# Patient Record
Sex: Female | Born: 1975 | Race: White | Hispanic: No | Smoking: Never smoker
Health system: Southern US, Community
[De-identification: ages and names within clinical notes are randomized; demographics above are authoritative.]

## PROBLEM LIST (undated history)

## (undated) DIAGNOSIS — IMO0002 Reserved for concepts with insufficient information to code with codable children: Secondary | ICD-10-CM

## (undated) DIAGNOSIS — R87619 Unspecified abnormal cytological findings in specimens from cervix uteri: Secondary | ICD-10-CM

## (undated) DIAGNOSIS — R112 Nausea with vomiting, unspecified: Secondary | ICD-10-CM

## (undated) DIAGNOSIS — O1400 Mild to moderate pre-eclampsia, unspecified trimester: Secondary | ICD-10-CM

## (undated) DIAGNOSIS — Z9889 Other specified postprocedural states: Secondary | ICD-10-CM

## (undated) DIAGNOSIS — O10919 Unspecified pre-existing hypertension complicating pregnancy, unspecified trimester: Secondary | ICD-10-CM

## (undated) DIAGNOSIS — K219 Gastro-esophageal reflux disease without esophagitis: Secondary | ICD-10-CM

## (undated) DIAGNOSIS — O139 Gestational [pregnancy-induced] hypertension without significant proteinuria, unspecified trimester: Secondary | ICD-10-CM

## (undated) HISTORY — PX: BREAST SURGERY: SHX581

## (undated) HISTORY — PX: TONSILLECTOMY: SUR1361

---

## 1999-05-28 ENCOUNTER — Inpatient Hospital Stay (HOSPITAL_COMMUNITY): Admission: AD | Admit: 1999-05-28 | Discharge: 1999-05-30 | Payer: Self-pay | Admitting: *Deleted

## 1999-06-24 ENCOUNTER — Other Ambulatory Visit: Admission: RE | Admit: 1999-06-24 | Discharge: 1999-06-24 | Payer: Self-pay | Admitting: *Deleted

## 2000-07-12 ENCOUNTER — Other Ambulatory Visit: Admission: RE | Admit: 2000-07-12 | Discharge: 2000-07-12 | Payer: Self-pay | Admitting: *Deleted

## 2003-10-26 HISTORY — PX: OTHER SURGICAL HISTORY: SHX169

## 2003-11-20 ENCOUNTER — Other Ambulatory Visit: Admission: RE | Admit: 2003-11-20 | Discharge: 2003-11-20 | Payer: Self-pay | Admitting: Obstetrics and Gynecology

## 2005-01-08 ENCOUNTER — Emergency Department (HOSPITAL_COMMUNITY): Admission: EM | Admit: 2005-01-08 | Discharge: 2005-01-08 | Payer: Self-pay | Admitting: *Deleted

## 2008-10-25 HISTORY — PX: CARPAL TUNNEL RELEASE: SHX101

## 2009-10-25 HISTORY — PX: TRIGGER FINGER RELEASE: SHX641

## 2010-10-25 NOTE — L&D Delivery Note (Signed)
Delivery Note At 5:55 PM a viable and healthy female was delivered via Vaginal, Spontaneous Delivery (Presentation: Left Occiput Anterior).  APGAR: 7, 8; weight 7 lb 9.5 oz (3445 g).   Placenta status: Intact, Spontaneous.  Cord: 3 vessels with the following complications: None.  Cord pH: na  Anesthesia: Epidural  Episiotomy: none Lacerations: Second degree, no extensions. Suture Repair: 2.0 vicryl rapide Est. Blood Loss (mL): 300  Mom to postpartum.  Baby to nursery-stable.  Kriss Perleberg J 07/25/2011, 6:11 PM

## 2011-01-05 LAB — ABO/RH: RH Type: POSITIVE

## 2011-01-05 LAB — RUBELLA ANTIBODY, IGM: Rubella: IMMUNE

## 2011-01-05 LAB — HIV ANTIBODY (ROUTINE TESTING W REFLEX): HIV: NONREACTIVE

## 2011-07-09 LAB — STREP B DNA PROBE: GBS: POSITIVE

## 2011-07-24 ENCOUNTER — Inpatient Hospital Stay (HOSPITAL_COMMUNITY)
Admission: RE | Admit: 2011-07-24 | Discharge: 2011-07-27 | DRG: 372 | Disposition: A | Discharge status: Home / Self Care | Payer: BC Managed Care – PPO | Source: Ambulatory Visit | Attending: Obstetrics and Gynecology | Admitting: Obstetrics and Gynecology

## 2011-07-24 ENCOUNTER — Encounter (HOSPITAL_COMMUNITY): Payer: Self-pay

## 2011-07-24 DIAGNOSIS — O10919 Unspecified pre-existing hypertension complicating pregnancy, unspecified trimester: Secondary | ICD-10-CM

## 2011-07-24 DIAGNOSIS — I10 Essential (primary) hypertension: Secondary | ICD-10-CM | POA: Diagnosis present

## 2011-07-24 DIAGNOSIS — Z22330 Carrier of Group B streptococcus: Secondary | ICD-10-CM

## 2011-07-24 DIAGNOSIS — O09529 Supervision of elderly multigravida, unspecified trimester: Secondary | ICD-10-CM | POA: Diagnosis present

## 2011-07-24 DIAGNOSIS — IMO0002 Reserved for concepts with insufficient information to code with codable children: Principal | ICD-10-CM | POA: Diagnosis present

## 2011-07-24 DIAGNOSIS — O99892 Other specified diseases and conditions complicating childbirth: Secondary | ICD-10-CM | POA: Diagnosis present

## 2011-07-24 DIAGNOSIS — O1400 Mild to moderate pre-eclampsia, unspecified trimester: Secondary | ICD-10-CM | POA: Diagnosis present

## 2011-07-24 HISTORY — DX: Gastro-esophageal reflux disease without esophagitis: K21.9

## 2011-07-24 HISTORY — DX: Other specified postprocedural states: R11.2

## 2011-07-24 HISTORY — DX: Mild to moderate pre-eclampsia, unspecified trimester: O14.00

## 2011-07-24 HISTORY — DX: Unspecified pre-existing hypertension complicating pregnancy, unspecified trimester: O10.919

## 2011-07-24 HISTORY — DX: Unspecified abnormal cytological findings in specimens from cervix uteri: R87.619

## 2011-07-24 HISTORY — DX: Other specified postprocedural states: Z98.890

## 2011-07-24 HISTORY — DX: Gestational (pregnancy-induced) hypertension without significant proteinuria, unspecified trimester: O13.9

## 2011-07-24 HISTORY — DX: Reserved for concepts with insufficient information to code with codable children: IMO0002

## 2011-07-24 LAB — COMPREHENSIVE METABOLIC PANEL
ALT: 12 U/L (ref 0–35)
Albumin: 3.2 g/dL — ABNORMAL LOW (ref 3.5–5.2)
Alkaline Phosphatase: 102 U/L (ref 39–117)
Chloride: 103 mEq/L (ref 96–112)
GFR calc Af Amer: >60 mL/min (ref >60)
Glucose, Bld: 68 mg/dL — ABNORMAL LOW (ref 70–99)
Potassium: 3.8 mEq/L (ref 3.5–5.1)
Sodium: 134 mEq/L — ABNORMAL LOW (ref 135–145)
Total Bilirubin: 0.5 mg/dL (ref 0.3–1.2)
Total Protein: 6.4 g/dL (ref 6.0–8.3)

## 2011-07-24 LAB — CBC
HCT: 33.1 % — ABNORMAL LOW (ref 36.0–46.0)
RBC: 3.78 MIL/uL — ABNORMAL LOW (ref 3.87–5.11)
RDW: 14.1 % (ref 11.5–15.5)
WBC: 12.9 10*3/uL — ABNORMAL HIGH (ref 4.0–10.5)

## 2011-07-24 MED ORDER — LACTATED RINGERS IV SOLN
INTRAVENOUS | Status: DC
  Administered 2011-07-24: 20:00:00 via INTRAVENOUS
  Administered 2011-07-25: 125 mL/h via INTRAVENOUS
  Administered 2011-07-25: 04:00:00 via INTRAVENOUS

## 2011-07-24 MED ORDER — DINOPROSTONE 10 MG VA INST
10.0000 mg | VAGINAL_INSERT | Freq: Once | VAGINAL | Status: AC
  Administered 2011-07-24: 10 mg via VAGINAL
  Filled 2011-07-24: qty 1

## 2011-07-24 MED ORDER — PENICILLIN G POTASSIUM 5000000 UNITS IJ SOLR: 5.0000 10*6.[IU] | Freq: Once | INTRAVENOUS | Status: DC

## 2011-07-24 MED ORDER — PENICILLIN G POTASSIUM 5000000 UNITS IJ SOLR
5.0000 10*6.[IU] | Freq: Once | INTRAVENOUS | Status: DC
  Filled 2011-07-24: qty 5

## 2011-07-24 MED ORDER — OXYTOCIN 20 UNITS IN LACTATED RINGERS INFUSION - SIMPLE: 125.0000 mL/h | Freq: Once | INTRAVENOUS | Status: DC

## 2011-07-24 MED ORDER — ZOLPIDEM TARTRATE 10 MG PO TABS
10.0000 mg | ORAL_TABLET | Freq: Every evening | ORAL | Status: DC | PRN
  Administered 2011-07-24: 10 mg via ORAL
  Filled 2011-07-24: qty 1

## 2011-07-24 MED ORDER — ACETAMINOPHEN 325 MG PO TABS
650.0000 mg | ORAL_TABLET | ORAL | Status: DC | PRN
  Administered 2011-07-24 – 2011-07-25 (×2): 650 mg via ORAL
  Filled 2011-07-24 (×3): qty 2

## 2011-07-24 MED ORDER — OXYTOCIN 20 UNITS IN LACTATED RINGERS INFUSION - SIMPLE
1.0000 m[IU]/min | INTRAVENOUS | Status: DC
  Administered 2011-07-25: 999 m[IU]/min via INTRAVENOUS
  Administered 2011-07-25: 6 m[IU]/min via INTRAVENOUS
  Administered 2011-07-25: 2 m[IU]/min via INTRAVENOUS
  Filled 2011-07-24: qty 1000

## 2011-07-24 MED ORDER — LIDOCAINE HCL (PF) 1 % IJ SOLN
30.0000 mL | INTRAMUSCULAR | Status: DC | PRN
  Filled 2011-07-24 (×2): qty 30

## 2011-07-24 MED ORDER — CITRIC ACID-SODIUM CITRATE 334-500 MG/5ML PO SOLN: 30.0000 mL | ORAL | Status: DC | PRN

## 2011-07-24 MED ORDER — PENICILLIN G POTASSIUM 5000000 UNITS IJ SOLR
2.5000 10*6.[IU] | INTRAVENOUS | Status: DC
  Administered 2011-07-25 (×2): 2.5 10*6.[IU] via INTRAVENOUS
  Filled 2011-07-24 (×5): qty 2.5

## 2011-07-24 MED ORDER — TERBUTALINE SULFATE 1 MG/ML IJ SOLN: 0.2500 mg | Freq: Once | INTRAMUSCULAR | Status: AC | PRN

## 2011-07-24 MED ORDER — IBUPROFEN 600 MG PO TABS: 600.0000 mg | ORAL_TABLET | Freq: Four times a day (QID) | ORAL | Status: DC | PRN

## 2011-07-24 MED ORDER — PENICILLIN G POTASSIUM 5000000 UNITS IJ SOLR
2.5000 10*6.[IU] | INTRAVENOUS | Status: DC
  Filled 2011-07-24 (×2): qty 2.5

## 2011-07-24 MED ORDER — PENICILLIN G POTASSIUM 5000000 UNITS IJ SOLR: 2.5000 10*6.[IU] | INTRAVENOUS | Status: DC

## 2011-07-24 MED ORDER — LACTATED RINGERS IV SOLN: 500.0000 mL | INTRAVENOUS | Status: DC | PRN

## 2011-07-24 MED ORDER — ONDANSETRON HCL 4 MG/2ML IJ SOLN
4.0000 mg | Freq: Four times a day (QID) | INTRAMUSCULAR | Status: DC | PRN
  Administered 2011-07-25 (×2): 4 mg via INTRAVENOUS
  Filled 2011-07-24 (×2): qty 2

## 2011-07-24 MED ORDER — FLEET ENEMA 7-19 GM/118ML RE ENEM: 1.0000 | ENEMA | RECTAL | Status: DC | PRN

## 2011-07-24 MED ORDER — OXYCODONE-ACETAMINOPHEN 5-325 MG PO TABS: 2.0000 | ORAL_TABLET | ORAL | Status: DC | PRN

## 2011-07-24 MED ORDER — LABETALOL HCL 200 MG PO TABS
200.0000 mg | ORAL_TABLET | Freq: Two times a day (BID) | ORAL | Status: DC
  Administered 2011-07-24: 200 mg via ORAL
  Filled 2011-07-24 (×4): qty 1

## 2011-07-24 MED ORDER — OXYTOCIN BOLUS FROM INFUSION
500.0000 mL | Freq: Once | INTRAVENOUS | Status: DC
  Filled 2011-07-24: qty 500

## 2011-07-24 MED ORDER — PENICILLIN G POTASSIUM 5000000 UNITS IJ SOLR
5.0000 10*6.[IU] | Freq: Once | INTRAVENOUS | Status: AC
  Administered 2011-07-25: 5 10*6.[IU] via INTRAVENOUS
  Filled 2011-07-24: qty 5

## 2011-07-24 NOTE — Progress Notes (Signed)
Maria [name] is a 35 y.o. G1P0 at [redacted]w[redacted]d by LMP admitted for induction of labor due to Pre-eclamptic toxemia of pregnancy..  Subjective:   Objective: There were no vitals taken for this visit.      FHT:  Reactive UC:   none SVE:    1/50/-2/vtx  Labs: No results found for this basename: WBC, HGB, HCT, MCV, PLT    Assessment / Plan: Induction of labor due to preeclampsia,  progressing well on pitocin  Labor: For ripening and induction Preeclampsia:  no signs or symptoms of toxicity Fetal Wellbeing:  Category I Pain Control:  Labor support without medications I/D:  n/a Anticipated MOD:  NSVD  [name] 07/24/2011, 7:43 PM

## 2011-07-24 NOTE — Progress Notes (Signed)
Pt c/o having difficulty swallowing. Pulse oximeter placed, O2 sats reading between 97-99% on room air. Pt states she feels better sitting up in the bed and says that it feels worse during UC's. Pt encouraged to try and swallow some water and take her BP med. Pt able to do so without difficulty.

## 2011-07-24 NOTE — H&P (Signed)
Maria [name], Maria [name]                 ACCOUNT NO.:  [id number]  MEDICAL RECORD NO.:  [id number]  LOCATION:  9167                          FACILITY:  [facility]  PHYSICIAN:  Lenoard Aden, M.D.DATE OF BIRTH:  08/23/1976  DATE OF ADMISSION:  07/24/2011 DATE OF DISCHARGE:                             HISTORY & PHYSICAL   CHIEF COMPLAINT:  Chronic hypertension with superimposed preeclampsia.  HISTORY OF PRESENT ILLNESS:  The patient is a 35 year old white female G5, P2 at 37-4/7 weeks' gestation with labile hypertension despite increasing doses of labetalol.  Her blood pressures have remained in the 140-150/90-110 range with 100 of proteinuria on spot dip in the last 2 days.  She presents now for cervical ripening and induction.  She denies headaches, visual symptoms, or epigastric pain.  She reports nausea but no vomiting.  She had normal PIH labs 2 days ago.  Her prenatal course has, otherwise, been complicated by labile hypertension and group B strep positivity.  PHYSICAL EXAMINATION:  GENERAL:  She is a well-developed, well- nourished, white female in no acute distress. HEENT:  Normal. NECK:  Supple.  Full range of motion. LUNGS:  Clear. HEART:  Regular rhythm. ABDOMEN:  Soft, gravid, nontender.  Estimated fetal weight 8 pounds. Cervix is 1, 50% vertex -2. EXTREMITIES:  There are no cords.  DTRs 2+.  No evidence of clonus. Trace pretibial edema. NEUROLOGIC:  Nonfocal. SKIN:  Intact.  She has a medication history remarkable for labetalol, Zantac, Ambien as needed, Zyrtec as needed, and Deplin.  She has a family history remarkable for Alzheimer disease, hypertension, lymphoma, diabetes, and MI.  She has a history of 2 previous vaginal deliveries and 1 TAB and 1 SAB.  Fetal heart tones are reactive.  Cervidil is to placed.  Surgical history is remarkable for breast reduction, tonsillectomy, and abdominoplasty.  IMPRESSION: 1. A 37-4/7-week intrauterine pregnancy. 2. Chronic  hypertension with superimposed exacerbation, probable     preeclampsia based on the evidence of proteinuria and labile blood     pressures. 3. GBS positive.  PLAN:  Will be to admit.  Cervidil.  Check PIH labs.  Start Pitocin in a.m. and anticipate cautious attempts at vaginal delivery.     Lenoard Aden, M.D.     RJT/MEDQ  D:  07/24/2011  T:  07/24/2011  Job:  [id number]

## 2011-07-25 ENCOUNTER — Encounter (HOSPITAL_COMMUNITY): Payer: Self-pay

## 2011-07-25 ENCOUNTER — Encounter (HOSPITAL_COMMUNITY): Payer: Self-pay | Admitting: Anesthesiology

## 2011-07-25 ENCOUNTER — Inpatient Hospital Stay (HOSPITAL_COMMUNITY): Payer: BC Managed Care – PPO | Admitting: Anesthesiology

## 2011-07-25 DIAGNOSIS — O10919 Unspecified pre-existing hypertension complicating pregnancy, unspecified trimester: Secondary | ICD-10-CM

## 2011-07-25 DIAGNOSIS — O1400 Mild to moderate pre-eclampsia, unspecified trimester: Secondary | ICD-10-CM

## 2011-07-25 HISTORY — DX: Unspecified pre-existing hypertension complicating pregnancy, unspecified trimester: O10.919

## 2011-07-25 HISTORY — DX: Mild to moderate pre-eclampsia, unspecified trimester: O14.00

## 2011-07-25 LAB — CBC
HCT: 33 % — ABNORMAL LOW (ref 36.0–46.0)
Hemoglobin: 11.2 g/dL — ABNORMAL LOW (ref 12.0–15.0)
MCHC: 33.9 g/dL (ref 30.0–36.0)
MCV: 88 fL (ref 78.0–100.0)
RBC: 3.74 MIL/uL — ABNORMAL LOW (ref 3.87–5.11)
RDW: 14.2 % (ref 11.5–15.5)
WBC: 18.5 10*3/uL — ABNORMAL HIGH (ref 4.0–10.5)

## 2011-07-25 MED ORDER — LABETALOL HCL 200 MG PO TABS
400.0000 mg | ORAL_TABLET | Freq: Two times a day (BID) | ORAL | Status: DC
  Administered 2011-07-25: 400 mg via ORAL
  Filled 2011-07-25 (×3): qty 2

## 2011-07-25 MED ORDER — BENZOCAINE-MENTHOL 20-0.5 % EX AERO: 1.0000 "application " | INHALATION_SPRAY | CUTANEOUS | Status: DC | PRN

## 2011-07-25 MED ORDER — EPHEDRINE 5 MG/ML INJ
10.0000 mg | INTRAVENOUS | Status: DC | PRN
  Filled 2011-07-25: qty 4

## 2011-07-25 MED ORDER — FENTANYL 2.5 MCG/ML BUPIVACAINE 1/10 % EPIDURAL INFUSION (WH - ANES)
14.0000 mL/h | INTRAMUSCULAR | Status: DC
Start: 2011-07-25 — End: 2011-07-25
  Administered 2011-07-25: 14 mL/h via EPIDURAL
  Filled 2011-07-25 (×3): qty 60

## 2011-07-25 MED ORDER — PHENYLEPHRINE 40 MCG/ML (10ML) SYRINGE FOR IV PUSH (FOR BLOOD PRESSURE SUPPORT)
80.0000 ug | PREFILLED_SYRINGE | INTRAVENOUS | Status: DC | PRN
  Filled 2011-07-25: qty 5

## 2011-07-25 MED ORDER — PHENYLEPHRINE 40 MCG/ML (10ML) SYRINGE FOR IV PUSH (FOR BLOOD PRESSURE SUPPORT)
80.0000 ug | PREFILLED_SYRINGE | INTRAVENOUS | Status: DC | PRN
  Filled 2011-07-25 (×2): qty 5

## 2011-07-25 MED ORDER — LIDOCAINE HCL 1.5 % IJ SOLN
INTRAMUSCULAR | Status: DC | PRN
  Administered 2011-07-25 (×2): 5 mL via INTRADERMAL

## 2011-07-25 MED ORDER — ONDANSETRON HCL 4 MG/2ML IJ SOLN: 4.0000 mg | INTRAMUSCULAR | Status: DC | PRN

## 2011-07-25 MED ORDER — ONDANSETRON HCL 4 MG PO TABS: 4.0000 mg | ORAL_TABLET | ORAL | Status: DC | PRN

## 2011-07-25 MED ORDER — OXYCODONE-ACETAMINOPHEN 5-325 MG PO TABS: 1.0000 | ORAL_TABLET | ORAL | Status: DC | PRN

## 2011-07-25 MED ORDER — SENNOSIDES-DOCUSATE SODIUM 8.6-50 MG PO TABS
2.0000 | ORAL_TABLET | Freq: Every day | ORAL | Status: DC
  Administered 2011-07-25 – 2011-07-26 (×2): 2 via ORAL

## 2011-07-25 MED ORDER — SIMETHICONE 80 MG PO CHEW: 80.0000 mg | CHEWABLE_TABLET | ORAL | Status: DC | PRN

## 2011-07-25 MED ORDER — DIPHENHYDRAMINE HCL 50 MG/ML IJ SOLN
12.5000 mg | INTRAMUSCULAR | Status: DC | PRN
  Administered 2011-07-25 (×2): 12.5 mg via INTRAVENOUS
  Filled 2011-07-25: qty 1

## 2011-07-25 MED ORDER — PRENATAL PLUS 27-1 MG PO TABS
1.0000 | ORAL_TABLET | Freq: Every day | ORAL | Status: DC
  Filled 2011-07-25 (×2): qty 1

## 2011-07-25 MED ORDER — DIPHENHYDRAMINE HCL 25 MG PO CAPS: 25.0000 mg | ORAL_CAPSULE | Freq: Four times a day (QID) | ORAL | Status: DC | PRN

## 2011-07-25 MED ORDER — LABETALOL HCL 200 MG PO TABS
200.0000 mg | ORAL_TABLET | Freq: Three times a day (TID) | ORAL | Status: DC
  Administered 2011-07-25 – 2011-07-26 (×2): 200 mg via ORAL
  Filled 2011-07-25 (×5): qty 1

## 2011-07-25 MED ORDER — LABETALOL HCL 200 MG PO TABS
200.0000 mg | ORAL_TABLET | Freq: Once | ORAL | Status: AC
  Administered 2011-07-25: 200 mg via ORAL
  Filled 2011-07-25: qty 1

## 2011-07-25 MED ORDER — LACTATED RINGERS IV SOLN: 500.0000 mL | Freq: Once | INTRAVENOUS | Status: DC

## 2011-07-25 MED ORDER — LANOLIN HYDROUS EX OINT: TOPICAL_OINTMENT | CUTANEOUS | Status: DC | PRN

## 2011-07-25 MED ORDER — ZOLPIDEM TARTRATE 5 MG PO TABS
5.0000 mg | ORAL_TABLET | Freq: Every evening | ORAL | Status: DC | PRN
  Administered 2011-07-25: 5 mg via ORAL
  Filled 2011-07-25: qty 1

## 2011-07-25 MED ORDER — FENTANYL 2.5 MCG/ML BUPIVACAINE 1/10 % EPIDURAL INFUSION (WH - ANES)
INTRAMUSCULAR | Status: DC | PRN
  Administered 2011-07-25: 14:00:00
  Administered 2011-07-25: 14 mL/h via EPIDURAL

## 2011-07-25 MED ORDER — EPHEDRINE 5 MG/ML INJ
10.0000 mg | INTRAVENOUS | Status: DC | PRN
  Filled 2011-07-25 (×2): qty 4

## 2011-07-25 MED ORDER — IBUPROFEN 600 MG PO TABS
600.0000 mg | ORAL_TABLET | Freq: Four times a day (QID) | ORAL | Status: DC
  Administered 2011-07-25 – 2011-07-27 (×7): 600 mg via ORAL
  Filled 2011-07-25 (×8): qty 1

## 2011-07-25 MED ORDER — WITCH HAZEL-GLYCERIN EX PADS: 1.0000 "application " | MEDICATED_PAD | CUTANEOUS | Status: DC | PRN

## 2011-07-25 MED ORDER — DIBUCAINE 1 % RE OINT: 1.0000 "application " | TOPICAL_OINTMENT | RECTAL | Status: DC | PRN

## 2011-07-25 MED ORDER — TETANUS-DIPHTH-ACELL PERTUSSIS 5-2.5-18.5 LF-MCG/0.5 IM SUSP: 0.5000 mL | Freq: Once | INTRAMUSCULAR | Status: DC

## 2011-07-25 NOTE — Progress Notes (Signed)
Maria [name] is a 35 y.o. [id number] at [redacted]w[redacted]d by LMP admitted for induction of labor due to Pre-eclamptic toxemia of pregnancy..  Subjective: Comfortable with epidural No s/s of PEC   Objective: BP 139/92  Pulse 69  Temp(Src) 98.2 F (36.8 C) (Axillary)  Resp 18  Ht 5\' 5"  (1.651 m)  Wt 106.595 kg (235 lb)  BMI 39.11 kg/m2  SpO2 97%      FHT:  FHR: 155 bpm, variability: moderate,  accelerations:  Present,  decelerations:  Absent UC:   regular, every 2 minutes SVE:   3-4/60/-1 AROM- clear IUPC placed without difficulty  Labs: Lab Results  Component Value Date   WBC 11.8* 07/25/2011   HGB 11.2* 07/25/2011   HCT 33.0* 07/25/2011   MCV 88.0 07/25/2011   PLT 203 07/25/2011    Assessment / Plan: Induction of labor due to preeclampsia and labile CHTN,  progressing well on pitocin  Labor: Progressing normally Preeclampsia:  no signs or symptoms of toxicity, intake and ouput balanced and labs stable Fetal Wellbeing:  Category I Pain Control:  Epidural I/D:  n/a Anticipated MOD:  NSVD  Maria [name] 07/25/2011, 1:30 PM

## 2011-07-25 NOTE — Anesthesia Postprocedure Evaluation (Signed)
Anesthesia Post Note  Patient: Maria [name]  Procedure(s) Performed: * No procedures listed *  Anesthesia type: Epidural  Patient location: Mother/Baby  Post pain: Pain level controlled  Post assessment: Post-op Vital signs reviewed  Last Vitals:  Filed Vitals:   07/25/11 1820  BP:   Pulse:   Temp:   Resp: 20    Post vital signs: Reviewed  Level of consciousness: awake  Complications: No apparent anesthesia complications

## 2011-07-25 NOTE — Anesthesia Preprocedure Evaluation (Signed)
Anesthesia Evaluation  Name, MR# and DOB Patient awake  General Assessment Comment  Reviewed: Allergy & Precautions, H&P , Patient's Chart, lab work & pertinent test results  Airway Mallampati: III TM Distance: >3 FB Neck ROM: full    Dental No notable dental hx.    Pulmonary  clear to auscultation  Pulmonary exam normal       Cardiovascular hypertension, Pt. on medications     Neuro/Psych Negative Neurological ROS  Negative Psych ROS   GI/Hepatic Neg liver ROS  GERD Medicated and Controlled  Endo/Other  Negative Endocrine ROSMorbid obesity  Renal/GU negative Renal ROS     Musculoskeletal negative musculoskeletal ROS (+)   Abdominal (+) obese,   Peds  Hematology negative hematology ROS (+)   Anesthesia Other Findings   Reproductive/Obstetrics (+) Pregnancy                           Anesthesia Physical Anesthesia Plan  ASA: III  Anesthesia Plan: Epidural   Post-op Pain Management:    Induction:   Airway Management Planned:   Additional Equipment:   Intra-op Plan:   Post-operative Plan:   Informed Consent: I have reviewed the patients History and Physical, chart, labs and discussed the procedure including the risks, benefits and alternatives for the proposed anesthesia with the patient or authorized representative who has indicated his/her understanding and acceptance.     Plan Discussed with:   Anesthesia Plan Comments:         Anesthesia Quick Evaluation

## 2011-07-25 NOTE — Anesthesia Procedure Notes (Signed)
Epidural Patient location during procedure: OB Start time: 07/25/2011 10:08 AM End time: 07/25/2011 10:13 AM Reason for block: procedure for pain  Staffing Anesthesiologist: Sandrea Hughs Performed by: anesthesiologist   Preanesthetic Checklist Completed: patient identified, site marked, surgical consent, pre-op evaluation, timeout performed, IV checked, risks and benefits discussed and monitors and equipment checked  Epidural Patient position: sitting Prep: site prepped and draped and DuraPrep Patient monitoring: continuous pulse ox and blood pressure Approach: midline Injection technique: LOR air  Needle:  Needle type: Tuohy  Needle gauge: 17 G Needle length: 9 cm Needle insertion depth: 5 cm cm Catheter type: closed end flexible Catheter size: 19 Gauge Catheter at skin depth: 10 cm Test dose: negative and 1.5% lidocaine  Assessment Sensory level: T8 Events: blood not aspirated, injection not painful, no injection resistance, negative IV test and no paresthesia

## 2011-07-26 ENCOUNTER — Encounter (HOSPITAL_COMMUNITY): Payer: Self-pay

## 2011-07-26 DIAGNOSIS — O10919 Unspecified pre-existing hypertension complicating pregnancy, unspecified trimester: Secondary | ICD-10-CM

## 2011-07-26 HISTORY — DX: Unspecified pre-existing hypertension complicating pregnancy, unspecified trimester: O10.919

## 2011-07-26 LAB — COMPREHENSIVE METABOLIC PANEL
AST: 18 U/L (ref 0–37)
BUN: 5 mg/dL — ABNORMAL LOW (ref 6–23)
CO2: 25 mEq/L (ref 19–32)
Chloride: 105 mEq/L (ref 96–112)
Creatinine, Ser: 0.51 mg/dL (ref 0.50–1.10)
GFR calc non Af Amer: >90 mL/min (ref >90)
Total Bilirubin: 0.8 mg/dL (ref 0.3–1.2)

## 2011-07-26 LAB — CBC
HCT: 29.6 % — ABNORMAL LOW (ref 36.0–46.0)
MCV: 88.1 fL (ref 78.0–100.0)
RBC: 3.36 MIL/uL — ABNORMAL LOW (ref 3.87–5.11)
WBC: 14.2 10*3/uL — ABNORMAL HIGH (ref 4.0–10.5)

## 2011-07-26 LAB — URIC ACID: Uric Acid, Serum: 4.5 mg/dL (ref 2.4–7.0)

## 2011-07-26 MED ORDER — ZOLPIDEM TARTRATE 10 MG PO TABS
10.0000 mg | ORAL_TABLET | Freq: Every evening | ORAL | Status: DC | PRN
  Administered 2011-07-26: 10 mg via ORAL
  Filled 2011-07-26: qty 1

## 2011-07-26 MED ORDER — LABETALOL HCL 200 MG PO TABS: 200.0000 mg | ORAL_TABLET | Freq: Every day | ORAL | Status: DC

## 2011-07-26 MED ORDER — ACETAMINOPHEN 325 MG PO TABS
650.0000 mg | ORAL_TABLET | ORAL | Status: DC | PRN
  Administered 2011-07-26 (×2): 650 mg via ORAL
  Filled 2011-07-26 (×2): qty 2

## 2011-07-26 MED ORDER — LABETALOL HCL 200 MG PO TABS: 400.0000 mg | ORAL_TABLET | Freq: Two times a day (BID) | ORAL | Status: DC

## 2011-07-26 MED ORDER — BENZOCAINE-MENTHOL 20-0.5 % EX AERO
INHALATION_SPRAY | CUTANEOUS | Status: AC
  Filled 2011-07-26: qty 56

## 2011-07-26 MED ORDER — LORATADINE 10 MG PO TABS
10.0000 mg | ORAL_TABLET | Freq: Every day | ORAL | Status: DC
  Filled 2011-07-26 (×3): qty 1

## 2011-07-26 MED ORDER — INFLUENZA VIRUS VACC SPLIT PF IM SUSP
0.5000 mL | Freq: Once | INTRAMUSCULAR | Status: AC
  Administered 2011-07-27: 0.5 mL via INTRAMUSCULAR
  Filled 2011-07-26: qty 0.5

## 2011-07-26 MED ORDER — FAMOTIDINE 20 MG PO TABS: 20.0000 mg | ORAL_TABLET | Freq: Two times a day (BID) | ORAL | Status: DC

## 2011-07-26 MED ORDER — LABETALOL HCL 200 MG PO TABS
200.0000 mg | ORAL_TABLET | Freq: Every day | ORAL | Status: DC
  Administered 2011-07-26: 200 mg via ORAL
  Filled 2011-07-26 (×3): qty 1

## 2011-07-26 MED ORDER — LABETALOL HCL 200 MG PO TABS
400.0000 mg | ORAL_TABLET | Freq: Two times a day (BID) | ORAL | Status: DC
  Administered 2011-07-26 – 2011-07-27 (×2): 400 mg via ORAL
  Filled 2011-07-26 (×4): qty 2

## 2011-07-26 NOTE — Anesthesia Postprocedure Evaluation (Signed)
  Anesthesia Post-op Note  Patient: Maria [name]  Procedure(s) Performed: * No procedures listed *  Patient Location: PACU and Mother/Baby  Anesthesia Type: Epidural  Level of Consciousness: awake, alert , oriented and patient cooperative  Airway and Oxygen Therapy: Patient Spontanous Breathing  Post-op Pain: none  Post-op Assessment: Post-op Vital signs reviewed, Patient's Cardiovascular Status Stable, Respiratory Function Stable, Patent Airway, No signs of Nausea or vomiting, Adequate PO intake and Pain level controlled  Post-op Vital Signs: Reviewed and stable  Complications: No apparent anesthesia complications

## 2011-07-26 NOTE — Progress Notes (Signed)
Post Partum Day #1            Subjective:   Pt reports feeling tired but well/ Tolerating po/ Voiding without problems/ No n/v/ Bleeding is moderate/ Pain controlled withprescription NSAID's including ibuprofen (Motrin)  Newborn info:  Information for the patient's newborn:  Gracemarie, Skeet [[id number]]  female  / feeding formula, hx breast reduction, was told would not be able to breastfeed          Objective:     VS: Blood pressure 123/83, pulse 70, temperature 97.9 F (36.6 C), temperature source Oral, resp. rate 20, height 5\' 5"  (1.651 m), weight 106.595 kg (235 lb), SpO2 98.00%, unknown if currently breastfeeding.   Intake/Output Summary (Last 24 hours) at 07/26/11 0819 Last data filed at 07/25/11 2038  Gross per 24 hour  Intake      0 ml  Output    450 ml  Net   -450 ml        Basename 07/26/11 0426 07/25/11 1900  WBC 14.2* 18.5*  HGB 10.3* 11.2*  HCT 29.6* 33.0*  PLT 164 198     Blood type: O/Positive/-- (03/13 0000)  Rubella: Immune (03/13 0000)     Physical Exam:   A & O x 3 NAD   Lungs: CTAB  Heart: RR  Abdomen: soft, non-tender, FF @ U  Perineum: repair intact - 2nd deg. perineal, edema none  Lochia: small  Extremities: neg Homans', edema trace pedal    Assessment/Plan:  PPD # 1 / 35 y.o., [id number] S/P:induced vaginal  Principal Problem:  *Postpartum care following vaginal delivery (9/30) cHTN - no evidence PEC, stable PIH labs BP stable on Labetalol     normal postpartum exam  Doing well  Continue routine post partum orders  Anticipate discharge home in AM.      LOS: 2 days   PAUL,DANIELA, CNM, MSN 07/26/2011, 8:19 AM

## 2011-07-26 NOTE — Addendum Note (Signed)
Addendum  created 07/26/11 0815 by Suella Grove   Modules edited:Charges VN, Notes Section

## 2011-07-27 MED ORDER — LABETALOL HCL 200 MG PO TABS: 400.0000 mg | ORAL_TABLET | Freq: Two times a day (BID) | ORAL | Status: AC

## 2011-07-27 MED ORDER — HYDROCHLOROTHIAZIDE 12.5 MG PO CAPS
25.0000 mg | ORAL_CAPSULE | Freq: Every day | ORAL | Status: DC
  Filled 2011-07-27: qty 2

## 2011-07-27 MED ORDER — HYDROCHLOROTHIAZIDE 25 MG PO TABS
25.0000 mg | ORAL_TABLET | Freq: Every day | ORAL | Status: DC
  Administered 2011-07-27: 25 mg via ORAL
  Filled 2011-07-27 (×2): qty 1

## 2011-07-27 MED ORDER — IBUPROFEN 600 MG PO TABS: 600.0000 mg | ORAL_TABLET | Freq: Four times a day (QID) | ORAL | Status: AC

## 2011-07-27 MED ORDER — HYDROCHLOROTHIAZIDE 12.5 MG PO CAPS: 25.0000 mg | ORAL_CAPSULE | Freq: Every day | ORAL | Status: AC

## 2011-07-27 NOTE — Progress Notes (Signed)
  PPD 2 SVD / Chronic hypertension  S:  Reports feeling well / no pain - cramping much better today             No PIH symptoms - no headache / vision change / epigastric pain             Tolerating po/ No nausea or vomiting             Bleeding is light             Pain controlled withprescription NSAID's including motrin only             Up ad lib / ambulatory  Newborn breast feeding  / female newborn   O:  A & O x 3 NAD             VS: Blood pressure 131/84, pulse 76, temperature 98.1 F (36.7 C), temperature source Oral, resp. rate 20, height 5\' 5"  (1.651 m), weight 106.595 kg (235 lb), SpO2 98.00%, unknown if currently breastfeeding.  LABS: Lab Results  Component Value Date   WBC 14.2* 07/26/2011   HGB 10.3* 07/26/2011   HCT 29.6* 07/26/2011   MCV 88.1 07/26/2011   PLT 164 07/26/2011     Lungs: Clear and unlabored  Heart: regular rate and rhythm / no mumurs  Abdomen: soft, non-tender, non-distended              Fundus: firm, non-tender, Ueven  Perineum: no edema  Lochia: light  Extremities: 1+ edema, no calf pain or tenderness,  Negative Homans    A: PPD # 2 SVD             Chronic hypertension on labetalol 400 BID - improved BP               Dependent edema  Doing well - stable status  P:  Routine post partum orders  Continue labetalol 400 BID - add HCTZ 25 mg daily in mornings             BP check at [facility] on Monday - call sooner if any PIH symptoms             Discharge home today  [facility] 07/27/2011, 9:01 AM

## 2011-07-27 NOTE — Discharge Summary (Addendum)
Obstetric Discharge Summary Reason for Admission: induction of labor Prenatal Procedures: NST and ultrasound Intrapartum Procedures: spontaneous vaginal delivery Postpartum Procedures: none Complications-Operative and Postpartum: 2nd degree perineal laceration and chronic hypertension Hemoglobin  Date Value Range Status  07/26/2011 10.3* 12.0-15.0 (g/dL) Final     HCT  Date Value Range Status  07/26/2011 29.6* 36.0-46.0 (%) Final    Discharge Diagnoses: Term Pregnancy-delivered and chronic hypertension  Discharge Information: Date: 07/27/2011 Activity: pelvic rest Diet: routine Medications: PNV, Ibuprofen and Labetalol 400 BID and HCTZ 25 mg daily and zyrtec 10mg  daily Condition: stable Instructions: refer to practice specific booklet Discharge to: home Follow-up Information    Follow up with Lenoard Aden, MD. Make an appointment in 6 weeks. ( AND - blood pressure check next Monday at [facility] - call for apt time)    Contact information:   [address] [place] [address] [phone]          Newborn Data: Live born female  Birth Weight: 7 lb 9.5 oz (3445 g) APGAR: 7, 8  Home with mother.  BAILEY,TANYA 07/27/2011, 9:09 AM

## 2011-08-03 ENCOUNTER — Inpatient Hospital Stay (HOSPITAL_COMMUNITY): Admission: AD | Admit: 2011-08-03 | Payer: Self-pay | Source: Ambulatory Visit | Admitting: Obstetrics and Gynecology

## 2013-09-28 ENCOUNTER — Emergency Department (HOSPITAL_BASED_OUTPATIENT_CLINIC_OR_DEPARTMENT_OTHER)
Admission: EM | Admit: 2013-09-28 | Discharge: 2013-09-28 | Disposition: A | Discharge status: Home / Self Care | Payer: BC Managed Care – PPO | Attending: Emergency Medicine | Admitting: Emergency Medicine

## 2013-09-28 ENCOUNTER — Encounter (HOSPITAL_BASED_OUTPATIENT_CLINIC_OR_DEPARTMENT_OTHER): Payer: Self-pay | Admitting: Emergency Medicine

## 2013-09-28 ENCOUNTER — Emergency Department (HOSPITAL_BASED_OUTPATIENT_CLINIC_OR_DEPARTMENT_OTHER): Payer: BC Managed Care – PPO

## 2013-09-28 DIAGNOSIS — Y9389 Activity, other specified: Secondary | ICD-10-CM | POA: Insufficient documentation

## 2013-09-28 DIAGNOSIS — S335XXA Sprain of ligaments of lumbar spine, initial encounter: Secondary | ICD-10-CM | POA: Insufficient documentation

## 2013-09-28 DIAGNOSIS — K219 Gastro-esophageal reflux disease without esophagitis: Secondary | ICD-10-CM | POA: Insufficient documentation

## 2013-09-28 DIAGNOSIS — G43909 Migraine, unspecified, not intractable, without status migrainosus: Secondary | ICD-10-CM | POA: Insufficient documentation

## 2013-09-28 DIAGNOSIS — Z79899 Other long term (current) drug therapy: Secondary | ICD-10-CM | POA: Insufficient documentation

## 2013-09-28 DIAGNOSIS — Y92410 Unspecified street and highway as the place of occurrence of the external cause: Secondary | ICD-10-CM | POA: Insufficient documentation

## 2013-09-28 DIAGNOSIS — S79919A Unspecified injury of unspecified hip, initial encounter: Secondary | ICD-10-CM | POA: Insufficient documentation

## 2013-09-28 DIAGNOSIS — S39012A Strain of muscle, fascia and tendon of lower back, initial encounter: Secondary | ICD-10-CM

## 2013-09-28 DIAGNOSIS — S139XXA Sprain of joints and ligaments of unspecified parts of neck, initial encounter: Secondary | ICD-10-CM | POA: Insufficient documentation

## 2013-09-28 DIAGNOSIS — S161XXA Strain of muscle, fascia and tendon at neck level, initial encounter: Secondary | ICD-10-CM

## 2013-09-28 DIAGNOSIS — S8990XA Unspecified injury of unspecified lower leg, initial encounter: Secondary | ICD-10-CM | POA: Insufficient documentation

## 2013-09-28 DIAGNOSIS — I10 Essential (primary) hypertension: Secondary | ICD-10-CM | POA: Insufficient documentation

## 2013-09-28 MED ORDER — KETOROLAC TROMETHAMINE 60 MG/2ML IM SOLN
60.0000 mg | Freq: Once | INTRAMUSCULAR | Status: AC
  Administered 2013-09-28: 60 mg via INTRAMUSCULAR
  Filled 2013-09-28: qty 2

## 2013-09-28 MED ORDER — IBUPROFEN 800 MG PO TABS: 800.0000 mg | ORAL_TABLET | Freq: Three times a day (TID) | ORAL | Status: DC

## 2013-09-28 MED ORDER — DIPHENHYDRAMINE HCL 50 MG/ML IJ SOLN
25.0000 mg | Freq: Once | INTRAMUSCULAR | Status: AC
  Administered 2013-09-28: 25 mg via INTRAMUSCULAR
  Filled 2013-09-28: qty 1

## 2013-09-28 MED ORDER — METOCLOPRAMIDE HCL 5 MG/ML IJ SOLN
10.0000 mg | Freq: Once | INTRAMUSCULAR | Status: AC
  Administered 2013-09-28: 10 mg via INTRAMUSCULAR
  Filled 2013-09-28: qty 2

## 2013-09-28 MED ORDER — CYCLOBENZAPRINE HCL 5 MG PO TABS: 5.0000 mg | ORAL_TABLET | Freq: Two times a day (BID) | ORAL | Status: DC | PRN
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
## 2013-09-28 NOTE — ED Provider Notes (Signed)
Medical screening examination/treatment/procedure(s) were performed by non-physician practitioner and as supervising physician I was immediately available for consultation/collaboration.  EKG Interpretation   None         Rolan Bucco, MD 09/28/13 2238
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
## 2014-08-26 ENCOUNTER — Encounter (HOSPITAL_BASED_OUTPATIENT_CLINIC_OR_DEPARTMENT_OTHER): Payer: Self-pay | Admitting: Emergency Medicine
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
## 2018-02-13 ENCOUNTER — Telehealth: Payer: Self-pay | Admitting: Hematology and Oncology

## 2018-02-13 NOTE — Telephone Encounter (Signed)
Unable to reach patient at phone number.  States not in service/  Letter/calendar mailed to patient

## 2018-03-06 ENCOUNTER — Encounter: Payer: Self-pay | Admitting: Hematology and Oncology

## 2018-03-06 ENCOUNTER — Telehealth: Payer: Self-pay | Admitting: Hematology and Oncology

## 2018-03-06 NOTE — Telephone Encounter (Signed)
Pt came to our office today under the impression she had an appt today. The appt was made on 4/22, but Archie Patten was unable to reach the pt at that time and scheduled the pt to see Dr. Gweneth Dimitri on 5/13 at 2pm and mailed a letter. Pt states that a letter with today's date and time was mailed to her. I didn't see any record of this in the patient's chart. I made contact with Lawson Fiscal from [facility] in April to let her know there weren't any labs to support the diagnosis. Also spoke to [facility] as well and let her know that we aren't able to see the pt without any labs. Pt was upset and said that the referring office shouldn't be blamed and that we should've let her know. I explained to the pt that it is with understanding that the referring office have all pertinent records in place when making the referral.   I spoke to [facility] form [facility] who said the patient was fussing, about the reason her appointment was cancelled. They had been trying to get in touch with the pt as well to get her blood drawn. Bonita Quin stated the pt indeed had her blood drawn and will fax the updated results. I voiced understanding.  Pt has been rescheduled to see Dr. Gweneth Dimitri on 5/16 at 2pm. Pt is aware to bring her past labs from Dr. Florentina Jenny office. I provided my direct phone number and new patient fax number as well as Katie Danner's phone number.

## 2018-03-09 ENCOUNTER — Inpatient Hospital Stay: Payer: Medicaid Other | Attending: Hematology and Oncology | Admitting: Hematology and Oncology

## 2018-03-09 ENCOUNTER — Telehealth: Payer: Self-pay | Admitting: Hematology and Oncology

## 2018-03-09 VITALS — BP 151/80 | HR 77 | Temp 98.2°F | Resp 18 | Ht 65.0 in | Wt 207.2 lb

## 2018-03-09 DIAGNOSIS — K219 Gastro-esophageal reflux disease without esophagitis: Secondary | ICD-10-CM | POA: Insufficient documentation

## 2018-03-09 DIAGNOSIS — D509 Iron deficiency anemia, unspecified: Secondary | ICD-10-CM | POA: Diagnosis present

## 2018-03-09 DIAGNOSIS — I10 Essential (primary) hypertension: Secondary | ICD-10-CM | POA: Diagnosis not present

## 2018-03-09 DIAGNOSIS — D508 Other iron deficiency anemias: Secondary | ICD-10-CM

## 2018-03-09 NOTE — Telephone Encounter (Signed)
Scheduled appt per 5/16 los - Gave patient aVS and calender per los.  

## 2018-03-17 ENCOUNTER — Inpatient Hospital Stay: Payer: Medicaid Other

## 2018-03-17 VITALS — BP 117/73 | HR 69 | Temp 98.7°F | Resp 16

## 2018-03-17 DIAGNOSIS — D508 Other iron deficiency anemias: Secondary | ICD-10-CM

## 2018-03-17 DIAGNOSIS — D509 Iron deficiency anemia, unspecified: Secondary | ICD-10-CM | POA: Diagnosis not present

## 2018-03-17 MED ORDER — SODIUM CHLORIDE 0.9 % IV SOLN
Freq: Once | INTRAVENOUS | Status: AC
  Administered 2018-03-17: 09:00:00 via INTRAVENOUS

## 2018-03-17 MED ORDER — SODIUM CHLORIDE 0.9 % IV SOLN
510.0000 mg | Freq: Once | INTRAVENOUS | Status: AC
  Administered 2018-03-17: 510 mg via INTRAVENOUS
  Filled 2018-03-17: qty 17

## 2018-03-17 NOTE — Patient Instructions (Signed)

## 2018-03-24 ENCOUNTER — Inpatient Hospital Stay: Payer: Medicaid Other

## 2018-03-24 ENCOUNTER — Encounter: Payer: Self-pay | Admitting: Hematology and Oncology

## 2018-03-24 VITALS — BP 113/70 | HR 61 | Temp 98.9°F | Resp 16 | Ht 65.0 in | Wt 200.0 lb

## 2018-03-24 DIAGNOSIS — D508 Other iron deficiency anemias: Secondary | ICD-10-CM

## 2018-03-24 DIAGNOSIS — D509 Iron deficiency anemia, unspecified: Secondary | ICD-10-CM | POA: Diagnosis not present

## 2018-03-24 MED ORDER — SODIUM CHLORIDE 0.9 % IV SOLN
Freq: Once | INTRAVENOUS | Status: AC
  Administered 2018-03-24: 10:00:00 via INTRAVENOUS

## 2018-03-24 MED ORDER — SODIUM CHLORIDE 0.9 % IV SOLN
510.0000 mg | Freq: Once | INTRAVENOUS | Status: AC
  Administered 2018-03-24: 510 mg via INTRAVENOUS
  Filled 2018-03-24: qty 17

## 2018-03-24 NOTE — Assessment & Plan Note (Addendum)
42 y.o. female with moderate microcytic hypochromic anemia and severe iron depletion.  The source of iron loss is unclear at this time, but likely has to do with multiparity and possible active bleeding from an unknown source most likely GI.  Gastroenterology evaluation is being planned.  Meanwhile, we will likely help patient significantly by proceeding with parenteral iron replacement considering her current ferritin in the single digits.  Plan: -Feraheme 510 mg IV x2 - Agree with plans for colonoscopy and EGD, consider obtaining CT of the abdomen and pelvis -Return to clinic in 2 months with labs obtained 2 to 3 days prior to the visit, possible additional IV iron supplementation

## 2018-03-24 NOTE — Progress Notes (Signed)
Maria [name]:  Assessment: IDA (iron deficiency anemia) 42 y.o. Maria with moderate microcytic hypochromic anemia and severe iron depletion.  The source of iron loss is unclear at this time, but likely has to do with multiparity and possible active bleeding from an unknown source most likely GI.  Gastroenterology evaluation is being planned.  Meanwhile, we will likely help patient significantly by proceeding with parenteral iron replacement considering her current ferritin in the single digits.  Plan: -Feraheme 510 mg IV x2 - Agree with plans for colonoscopy and EGD, consider obtaining CT of the abdomen and pelvis -Return to clinic in 2 months with labs obtained 2 to 3 days prior to the [name], possible additional IV iron supplementation   Voice recognition software was used and creation of this note. Despite my best effort at editing the text, some misspelling/errors may have occurred. Orders Placed This Encounter  Procedures  . CBC with Differential (Cancer Center Only)    Standing Status:   Future    Standing Expiration Date:   03/10/2019  . CMP ([place] only)    Standing Status:   Future    Standing Expiration Date:   03/10/2019  . Iron and TIBC    Standing Status:   Future    Standing Expiration Date:   03/10/2019  . Ferritin    Standing Status:   Future    Standing Expiration Date:   03/10/2019    All questions were answered.  . The patient knows to call the clinic with any problems, questions or concerns.  This note was electronically signed.    History of Presenting Illness Maria [name] 42 y.o. presenting to the [place] for deficiency anemia evaluation and management, referred by Dr Maria [name].  Has minimal past medical history including hypertension, GERD, she is [id number].  Currently, not receiving oral iron supplementation due to poor tolerance of pill form of iron.  No significant family history of hematological disorders.  At  this time, patient reports profound fatigue, decreased ability to participate in activities of daily living.  Denies any fevers, chills, night sweats.  No unexpected weight loss or weight gain.  Denies any active melena, hematochezia, no excessive vaginal bleeding.  Patient is followed by gastroenterology at [facility] (Dr Maria [name]) who is planning EGD and colonoscopy.  Oncological/hematological History: --Labs, 07/26/11: WBC 14.2, Hgb 10.3, MCV 88.1, MCH 30.7, MCHC 34.8, RDW 14.4,  Plt 164; --Labs, 11/04/17: WBC   8.5, Hgb   9.7,        Plt 352; --Labs, 03/06/18: WBC   5.6, Hgb   8.4, MCV 69.0, MCH 22.3, MCHC 32.3, RDW 13.6, Plt 344; Fe 12, FeSat 3%, TIBC 363, Ferritin 3  Medical History: Past Medical History:  Diagnosis Date  . Abnormal Pap smear     Colposcopy 1998  . Chronic hypertension complicating or reason for care during puerperium 07/26/2011  . Chronic hypertension with exacerbation during pregnancy 07/25/2011  . GERD (gastroesophageal reflux disease)   . Mild preeclampsia 07/25/2011  . PONV (postoperative nausea and vomiting)   . Postpartum care following vaginal delivery (9/30) 07/26/2011  . Pregnancy induced hypertension     Surgical History: Past Surgical History:  Procedure Laterality Date  . BREAST SURGERY     2005 - Breast Reduction  . CARPAL TUNNEL RELEASE  2010  . TONSILLECTOMY     2007  . TRIGGER FINGER RELEASE  2011  . Tummy Tuck  2005  Family History: No family history on file.  Social History: Social History   Socioeconomic History  . Marital status: Married    Spouse name: Not on file  . Number of children: Not on file  . Years of education: Not on file  . Highest education level: Not on file  Occupational History  . Not on file  Social Needs  . Financial resource strain: Not on file  . Food insecurity:    Worry: Not on file    Inability: Not on file  . Transportation needs:    Medical: Not on file    Non-medical:  Not on file  Tobacco Use  . Smoking status: Never Smoker  Substance and Sexual Activity  . Alcohol use: No  . Drug use: No  . Sexual activity: Yes  Lifestyle  . Physical activity:    Days per week: Not on file    Minutes per session: Not on file  . Stress: Not on file  Relationships  . Social connections:    Talks on phone: Not on file    Gets together: Not on file    Attends religious service: Not on file    Active member of club or organization: Not on file    Attends meetings of clubs or organizations: Not on file    Relationship status: Not on file  . Intimate partner violence:    Fear of current or ex partner: Not on file    Emotionally abused: Not on file    Physically abused: Not on file    Forced sexual activity: Not on file  Other Topics Concern  . Not on file  Social History Narrative  . Not on file    Allergies: No Known Allergies  Medications:  Current Outpatient Medications  Medication Sig Dispense Refill  . amphetamine-dextroamphetamine (ADDERALL) 20 MG tablet Take 20 mg by mouth 3 (three) times daily.    Marland Kitchen escitalopram (LEXAPRO) 20 MG tablet Take 20 mg by mouth daily.    Marland Kitchen LORazepam (ATIVAN) 2 MG tablet Take 2 mg by mouth every 6 (six) hours as needed for sleep (as needed).    . QUEtiapine (SEROQUEL) 200 MG tablet Take 200 mg by mouth at bedtime.    . cetirizine (ZYRTEC) 10 MG tablet Take 10 mg by mouth daily.      . hydrochlorothiazide (MICROZIDE) 12.5 MG capsule Take 2 capsules (25 mg total) by mouth daily. 14 capsule 0  . labetalol (NORMODYNE) 200 MG tablet Take 2 tablets (400 mg total) by mouth 2 (two) times daily. (Patient taking differently: Take 100 mg by mouth 2 (two) times daily. ) 60 tablet 0   No current facility-administered medications for this [name].     Review of Systems: Review of Systems  All other systems reviewed and are negative.    PHYSICAL EXAMINATION Blood pressure (!) 151/80, pulse 77, temperature 98.2 F (36.8 C),  temperature source Oral, resp. rate 18, height 5' 5" (1.651 m), weight 207 lb 3.2 oz (94 kg), SpO2 100 %.  ECOG PERFORMANCE STATUS: 1 - Symptomatic but completely ambulatory  Physical Exam  Constitutional: She is oriented to person, place, and time. She appears well-developed and well-nourished. No distress.  HENT:  Head: Normocephalic and atraumatic.  Mouth/Throat: Oropharynx is clear and moist. No oropharyngeal exudate.  Eyes: Pupils are equal, round, and reactive to light. Conjunctivae and EOM are normal. No scleral icterus.  Neck: No thyromegaly present.  Cardiovascular: Normal rate, regular rhythm, normal heart sounds and intact  distal pulses. Exam reveals no gallop and no friction rub.  No murmur heard. Pulmonary/Chest: Effort normal and breath sounds normal. No stridor. No respiratory distress. She has no wheezes.  Abdominal: Soft. Bowel sounds are normal. She exhibits no distension and no mass. There is no tenderness. There is no guarding.  Musculoskeletal: She exhibits no edema.  Lymphadenopathy:    She has no cervical adenopathy.  Neurological: She is alert and oriented to person, place, and time. She displays normal reflexes. No cranial nerve deficit or sensory deficit.  Skin: Skin is warm and dry. No rash noted. She is not diaphoretic. No erythema. There is pallor.     LABORATORY DATA: I have personally reviewed the data as listed: No visits with results within 1 Week(s) from this [name].  Latest known [name] with results is:  Admission on 07/24/2011, Discharged on 07/27/2011  Component Date Value Ref Range Status  . WBC 07/24/2011 12.9* 4.0 - 10.5 K/uL Final  . RBC 07/24/2011 3.78* 3.87 - 5.11 MIL/uL Final  . Hemoglobin 07/24/2011 11.5* 12.0 - 15.0 g/dL Final  . HCT 07/24/2011 33.1* 36.0 - 46.0 % Final  . MCV 07/24/2011 87.6  78.0 - 100.0 fL Final  . MCH 07/24/2011 30.4  26.0 - 34.0 pg Final  . MCHC 07/24/2011 34.7  30.0 - 36.0 g/dL Final  . RDW 07/24/2011 14.1  11.5 -  15.5 % Final  . Platelets 07/24/2011 218  150 - 400 K/uL Final  . RPR Ser Ql 07/24/2011 NON REACTIVE  NON REACTIVE Final  . Sodium 07/24/2011 134* 135 - 145 mEq/L Final  . Potassium 07/24/2011 3.8  3.5 - 5.1 mEq/L Final  . Chloride 07/24/2011 103  96 - 112 mEq/L Final  . CO2 07/24/2011 21  19 - 32 mEq/L Final  . Glucose, Bld 07/24/2011 68* 70 - 99 mg/dL Final  . BUN 07/24/2011 11  6 - 23 mg/dL Final  . Creatinine, Ser 07/24/2011 0.51  0.50 - 1.10 mg/dL Final  . Calcium 07/24/2011 9.5  8.4 - 10.5 mg/dL Final  . Total Protein 07/24/2011 6.4  6.0 - 8.3 g/dL Final  . Albumin 07/24/2011 3.2* 3.5 - 5.2 g/dL Final  . AST 07/24/2011 18  0 - 37 U/L Final  . ALT 07/24/2011 12  0 - 35 U/L Final  . Alkaline Phosphatase 07/24/2011 102  39 - 117 U/L Final  . Total Bilirubin 07/24/2011 0.5  0.3 - 1.2 mg/dL Final  . GFR calc non Af Amer 07/24/2011 >60  >60 mL/min Final  . GFR calc Af Amer 07/24/2011 >60  >60 mL/min Final   Comment:                                 The eGFR has been calculated                          using the MDRD equation.                          This calculation has not been                          validated in all clinical                          situations.  eGFR's persistently                          <60 mL/min signify                          possible Chronic Kidney Disease.  Marland Kitchen LDH 07/24/2011 270* 94 - 250 U/L Final  . Uric Acid, Serum 07/24/2011 4.2  2.4 - 7.0 mg/dL Final  . Antibody Screen 01/05/2011 Negative   Final  . HIV 01/05/2011 Non-reactive   Final  . RH Type  01/05/2011 Positive   Final  . ABO Grouping 01/05/2011 O   Final  . GBS 07/09/2011 Positive   Final  . Rubella 01/05/2011 Immune   Final  . Hepatitis B Surface Ag 01/05/2011 Negative   Final  . RPR 01/05/2011 Nonreactive   Final  . RPR 05/07/2011 Nonreactive   Final  . WBC 07/25/2011 11.8* 4.0 - 10.5 K/uL Final  . RBC 07/25/2011 3.75* 3.87 - 5.11 MIL/uL Final  . Hemoglobin  07/25/2011 11.2* 12.0 - 15.0 g/dL Final  . HCT 07/25/2011 33.0* 36.0 - 46.0 % Final  . MCV 07/25/2011 88.0  78.0 - 100.0 fL Final  . MCH 07/25/2011 29.9  26.0 - 34.0 pg Final  . MCHC 07/25/2011 33.9  30.0 - 36.0 g/dL Final  . RDW 07/25/2011 14.2  11.5 - 15.5 % Final  . Platelets 07/25/2011 203  150 - 400 K/uL Final  . WBC 07/25/2011 18.5* 4.0 - 10.5 K/uL Final  . RBC 07/25/2011 3.74* 3.87 - 5.11 MIL/uL Final  . Hemoglobin 07/25/2011 11.2* 12.0 - 15.0 g/dL Final  . HCT 07/25/2011 33.0* 36.0 - 46.0 % Final  . MCV 07/25/2011 88.2  78.0 - 100.0 fL Final  . MCH 07/25/2011 29.9  26.0 - 34.0 pg Final  . MCHC 07/25/2011 33.9  30.0 - 36.0 g/dL Final  . RDW 07/25/2011 14.3  11.5 - 15.5 % Final  . Platelets 07/25/2011 198  150 - 400 K/uL Final  . WBC 07/26/2011 14.2* 4.0 - 10.5 K/uL Final  . RBC 07/26/2011 3.36* 3.87 - 5.11 MIL/uL Final  . Hemoglobin 07/26/2011 10.3* 12.0 - 15.0 g/dL Final  . HCT 07/26/2011 29.6* 36.0 - 46.0 % Final  . MCV 07/26/2011 88.1  78.0 - 100.0 fL Final  . MCH 07/26/2011 30.7  26.0 - 34.0 pg Final  . MCHC 07/26/2011 34.8  30.0 - 36.0 g/dL Final  . RDW 07/26/2011 14.4  11.5 - 15.5 % Final  . Platelets 07/26/2011 164  150 - 400 K/uL Final  . Sodium 07/26/2011 135  135 - 145 mEq/L Final  . Potassium 07/26/2011 3.6  3.5 - 5.1 mEq/L Final  . Chloride 07/26/2011 105  96 - 112 mEq/L Final  . CO2 07/26/2011 25  19 - 32 mEq/L Final  . Glucose, Bld 07/26/2011 88  70 - 99 mg/dL Final  . BUN 07/26/2011 5* 6 - 23 mg/dL Final  . Creatinine, Ser 07/26/2011 0.51  0.50 - 1.10 mg/dL Final  . Calcium 07/26/2011 9.1  8.4 - 10.5 mg/dL Final  . Total Protein 07/26/2011 5.3* 6.0 - 8.3 g/dL Final  . Albumin 07/26/2011 2.5* 3.5 - 5.2 g/dL Final  . AST 07/26/2011 18  0 - 37 U/L Final  . ALT 07/26/2011 10  0 - 35 U/L Final  . Alkaline Phosphatase 07/26/2011 80  39 - 117 U/L Final  . Total Bilirubin 07/26/2011 0.8  0.3 -  1.2 mg/dL Final  . GFR calc non Af Amer 07/26/2011 >90  >90 mL/min  Final  . GFR calc Af Amer 07/26/2011 >90  >90 mL/min Final   Comment:                                 The eGFR has been calculated                          using the CKD EPI equation.                          This calculation has not been                          validated in all clinical                          situations.                          eGFR's persistently                          <90 mL/min signify                          possible Chronic Kidney Disease.  Marland Kitchen LDH 07/26/2011 189  94 - 250 U/L Final  . Uric Acid, Serum 07/26/2011 4.5  2.4 - 7.0 mg/dL Final         Ardath Sax, MD

## 2018-03-24 NOTE — Progress Notes (Signed)
Post infusion VS:  BP 113/70 HR 61 RESP 16 O2 98% TEMP 98.40F

## 2018-03-24 NOTE — Patient Instructions (Signed)

## 2018-05-09 ENCOUNTER — Inpatient Hospital Stay: Payer: Medicaid Other | Attending: Hematology and Oncology

## 2018-05-09 DIAGNOSIS — R718 Other abnormality of red blood cells: Secondary | ICD-10-CM | POA: Diagnosis not present

## 2018-05-09 DIAGNOSIS — Z800 Family history of malignant neoplasm of digestive organs: Secondary | ICD-10-CM | POA: Insufficient documentation

## 2018-05-09 DIAGNOSIS — D508 Other iron deficiency anemias: Secondary | ICD-10-CM

## 2018-05-09 DIAGNOSIS — D509 Iron deficiency anemia, unspecified: Secondary | ICD-10-CM | POA: Insufficient documentation

## 2018-05-09 DIAGNOSIS — Z808 Family history of malignant neoplasm of other organs or systems: Secondary | ICD-10-CM | POA: Diagnosis not present

## 2018-05-09 LAB — CBC WITH DIFFERENTIAL (CANCER CENTER ONLY)
Basophils Absolute: 0 10*3/uL (ref 0.0–0.1)
Basophils Relative: 1 %
Eosinophils Absolute: 0.2 10*3/uL (ref 0.0–0.5)
Eosinophils Relative: 3 %
HEMATOCRIT: 35.6 % (ref 34.8–46.6)
Hemoglobin: 12.3 g/dL (ref 11.6–15.9)
LYMPHS ABS: 1.6 10*3/uL (ref 0.9–3.3)
LYMPHS PCT: 23 %
MCH: 27.1 pg (ref 25.1–34.0)
MCHC: 34.5 g/dL (ref 31.5–36.0)
MCV: 78.4 fL — AB (ref 79.5–101.0)
MONO ABS: 0.5 10*3/uL (ref 0.1–0.9)
MONOS PCT: 8 %
NEUTROS PCT: 65 %
Neutro Abs: 4.4 10*3/uL (ref 1.5–6.5)
Platelet Count: 291 10*3/uL (ref 145–400)
RBC: 4.54 MIL/uL (ref 3.70–5.45)
RDW: 23.5 % — ABNORMAL HIGH (ref 11.2–14.5)
WBC Count: 6.8 10*3/uL (ref 3.9–10.3)

## 2018-05-09 LAB — CMP (CANCER CENTER ONLY)
ALBUMIN: 4.2 g/dL (ref 3.5–5.0)
ALT: 12 U/L (ref 0–44)
ANION GAP: 8 (ref 5–15)
AST: 13 U/L — ABNORMAL LOW (ref 15–41)
Alkaline Phosphatase: 72 U/L (ref 38–126)
BILIRUBIN TOTAL: 0.6 mg/dL (ref 0.3–1.2)
BUN: 11 mg/dL (ref 6–20)
CALCIUM: 9.7 mg/dL (ref 8.9–10.3)
CO2: 27 mmol/L (ref 22–32)
Chloride: 102 mmol/L (ref 98–111)
Creatinine: 0.79 mg/dL (ref 0.44–1.00)
GLUCOSE: 99 mg/dL (ref 70–99)
Potassium: 3.7 mmol/L (ref 3.5–5.1)
Sodium: 137 mmol/L (ref 135–145)
TOTAL PROTEIN: 7.3 g/dL (ref 6.5–8.1)

## 2018-05-09 LAB — IRON AND TIBC
Iron: 91 ug/dL (ref 41–142)
SATURATION RATIOS: 32 % (ref 21–57)
TIBC: 285 ug/dL (ref 236–444)
UIBC: 194 ug/dL

## 2018-05-09 LAB — FERRITIN: Ferritin: 33 ng/mL (ref 11–307)

## 2018-05-11 ENCOUNTER — Telehealth: Payer: Self-pay | Admitting: Nurse Practitioner

## 2018-05-11 ENCOUNTER — Encounter: Payer: Self-pay | Admitting: Nurse Practitioner

## 2018-05-11 ENCOUNTER — Inpatient Hospital Stay (HOSPITAL_BASED_OUTPATIENT_CLINIC_OR_DEPARTMENT_OTHER): Payer: Medicaid Other | Admitting: Nurse Practitioner

## 2018-05-11 ENCOUNTER — Inpatient Hospital Stay: Payer: Medicaid Other

## 2018-05-11 VITALS — BP 133/99 | HR 73 | Temp 98.9°F | Resp 20 | Ht 65.0 in | Wt 214.2 lb

## 2018-05-11 DIAGNOSIS — D509 Iron deficiency anemia, unspecified: Secondary | ICD-10-CM | POA: Diagnosis not present

## 2018-05-11 DIAGNOSIS — Z808 Family history of malignant neoplasm of other organs or systems: Secondary | ICD-10-CM | POA: Diagnosis not present

## 2018-05-11 DIAGNOSIS — D508 Other iron deficiency anemias: Secondary | ICD-10-CM

## 2018-05-11 DIAGNOSIS — R718 Other abnormality of red blood cells: Secondary | ICD-10-CM

## 2018-05-11 DIAGNOSIS — Z800 Family history of malignant neoplasm of digestive organs: Secondary | ICD-10-CM

## 2018-05-11 NOTE — Progress Notes (Addendum)
[facility] OFFICE PROGRESS NOTE   Diagnosis: Iron deficiency anemia  INTERVAL HISTORY:   Maria [name] returns as scheduled for follow-up of iron deficiency anemia.  She was previously followed by Dr. Gweneth Dimitri. Per his office note 03/09/2018 she had a moderate microcytic hypochromic anemia and severe iron depletion with the source of iron loss unclear possibly due to multiparity and possible active bleeding from an unknown source most likely GI.  Gastroenterology evaluation noted to be planned.  Labs done 03/07/2018 showed a hemoglobin of 8.4, MCV 69, ferritin 3.  She received Feraheme 510 mg on 03/17/2018 and 03/24/2018.  She reports undergoing upper and lower GI evaluation 12/08/2017, both reported negative.  She has been referred for a capsule endoscopy.  She has a monthly menstrual cycle without excessive bleeding.  She denies any other bleeding.  Specifically no bloody or black stools and no hematuria.  She reports taking oral iron in the past for about 2 months with no improvement in her blood work.  She was able to tolerate the oral iron without difficulty.  She reports undergoing a Heller myotomy in August 2014 for achalasia.  No other abdominal surgery.  She reports minimal improvement in her energy level and dyspnea on exertion since receiving the IV iron in May.  She has a poor appetite.  She notes less ice craving.  She lives in [facility].  She has 3 children.  She reports being pregnant earlier this year with a miscarriage around 6 weeks.  Her family history is significant for mother deceased at age 79 with pancreas cancer and father deceased age 59 with cholangiocarcinoma.  Objective:  Vital signs in last 24 hours:  Blood pressure (!) 133/99, pulse 73, temperature 98.9 F (37.2 C), temperature source Oral, resp. rate 20, height 5\' 5"  (1.651 m), weight 214 lb 3.2 oz (97.2 kg), SpO2 99 %.    HEENT: No thrush or ulcers. Resp: Lungs clear  bilaterally. Cardio: Regular rate and rhythm. GI: Abdomen soft and nontender.  No hepatospleno megaly. Vascular: No leg edema.    Lab Results:  Lab Results  Component Value Date   WBC 6.8 05/09/2018   HGB 12.3 05/09/2018   HCT 35.6 05/09/2018   MCV 78.4 (L) 05/09/2018   PLT 291 05/09/2018   NEUTROABS 4.4 05/09/2018    Imaging:  No results found.  Medications: I have reviewed the patient's current medications.  Assessment/Plan: 1. Iron deficiency anemia question due to menstrual blood loss.  Negative GI evaluation to date.  Small bowel capsule endoscopy planned.  Status post IV iron May 2019 with correction of the hemoglobin into normal range, persistent microcytosis. 2. Family history significant for mother deceased age of 19 with pancreatic cancer father age 72 with cholangiocarcinoma.  Referred to genetics.  Disposition: Ms. Deahl appears stable.  She has iron deficiency anemia possibly due to menstrual blood loss.  GI evaluation to date is negative.  A small bowel capsule endoscopy is planned.  She received IV iron approximately 2 months ago.  The hemoglobin has corrected into low normal range; ferritin improved from 3 to 33.  She has persistent red cell microcytosis.  She will begin ferrous sulfate 325 mg twice daily.  We discussed the possibility of nausea and constipation with oral iron.  She understands to contact the office should she develop either of these symptoms.  We will check a urinalysis when she returns for her next visit.  She has a family history significant for malignancy  as noted above.  We made a referral to the genetics counselor.  She will return for labs and a follow-up visit in 6 to 8 weeks.  She will contact the office in the interim as outlined above or with any other problems.  Patient seen with Dr. Truett PernaSherrill.  25 minutes were spent face-to-face at today's visit with the majority of that time involved in counseling/coordination of care.    Lonna CobbLisa  Lemoyne Nestor ANP/GNP-BC   05/11/2018  12:03 PM  This was a shared visit with Lonna CobbLisa Louie Meaders.  Ms. Henderson NewcomerSexton has a history of iron deficiency anemia, most likely secondary to menstrual blood loss.  She will complete a GI evaluation. She is not anemic at present.  She will begin a trial of oral iron and return for a CBC in approximately 6 weeks.  Mancel BaleBrad Sherrill, MD

## 2018-05-11 NOTE — Telephone Encounter (Signed)
Gave patient avs and calendar of upcoming aug appts.  °

## 2018-05-29 ENCOUNTER — Inpatient Hospital Stay: Payer: Medicaid Other | Attending: Hematology and Oncology

## 2018-05-29 ENCOUNTER — Inpatient Hospital Stay: Payer: Medicaid Other | Admitting: Genetics

## 2018-06-07 ENCOUNTER — Encounter: Payer: Self-pay | Admitting: Genetics

## 2018-06-29 ENCOUNTER — Inpatient Hospital Stay: Payer: Medicaid Other | Attending: Hematology and Oncology

## 2018-06-29 ENCOUNTER — Inpatient Hospital Stay: Payer: Medicaid Other | Admitting: Oncology

## 2018-09-28 ENCOUNTER — Encounter: Payer: Self-pay | Admitting: Genetic Counselor

## 2021-12-11 ENCOUNTER — Encounter: Payer: Self-pay | Admitting: Hematology and Oncology

## 2021-12-11 ENCOUNTER — Emergency Department (HOSPITAL_BASED_OUTPATIENT_CLINIC_OR_DEPARTMENT_OTHER)
Admission: EM | Admit: 2021-12-11 | Discharge: 2021-12-12 | Disposition: A | Discharge status: Home / Self Care | Payer: Medicaid Other | Attending: Emergency Medicine | Admitting: Emergency Medicine

## 2021-12-11 ENCOUNTER — Encounter (HOSPITAL_BASED_OUTPATIENT_CLINIC_OR_DEPARTMENT_OTHER): Payer: Self-pay

## 2021-12-11 ENCOUNTER — Other Ambulatory Visit: Payer: Self-pay

## 2021-12-11 DIAGNOSIS — N179 Acute kidney failure, unspecified: Secondary | ICD-10-CM | POA: Diagnosis not present

## 2021-12-11 DIAGNOSIS — R7989 Other specified abnormal findings of blood chemistry: Secondary | ICD-10-CM | POA: Diagnosis present

## 2021-12-11 LAB — BASIC METABOLIC PANEL
Anion gap: 5 (ref 5–15)
BUN: 30 mg/dL — ABNORMAL HIGH (ref 6–20)
CO2: 25 mmol/L (ref 22–32)
Calcium: 8.8 mg/dL — ABNORMAL LOW (ref 8.9–10.3)
Chloride: 104 mmol/L (ref 98–111)
Creatinine, Ser: 1.41 mg/dL — ABNORMAL HIGH (ref 0.44–1.00)
GFR, Estimated: 46 mL/min — ABNORMAL LOW (ref >60)
Glucose, Bld: 113 mg/dL — ABNORMAL HIGH (ref 70–99)
Potassium: 3.5 mmol/L (ref 3.5–5.1)
Sodium: 134 mmol/L — ABNORMAL LOW (ref 135–145)

## 2021-12-11 LAB — COMPREHENSIVE METABOLIC PANEL
ALT: 11 U/L (ref 0–44)
AST: 19 U/L (ref 15–41)
Albumin: 4.4 g/dL (ref 3.5–5.0)
Alkaline Phosphatase: 60 U/L (ref 38–126)
Anion gap: 8 (ref 5–15)
BUN: 36 mg/dL — ABNORMAL HIGH (ref 6–20)
CO2: 23 mmol/L (ref 22–32)
Calcium: 9 mg/dL (ref 8.9–10.3)
Chloride: 101 mmol/L (ref 98–111)
Creatinine, Ser: 1.71 mg/dL — ABNORMAL HIGH (ref 0.44–1.00)
GFR, Estimated: 37 mL/min — ABNORMAL LOW (ref >60)
Glucose, Bld: 95 mg/dL (ref 70–99)
Potassium: 3.3 mmol/L — ABNORMAL LOW (ref 3.5–5.1)
Sodium: 132 mmol/L — ABNORMAL LOW (ref 135–145)
Total Bilirubin: 0.3 mg/dL (ref 0.3–1.2)
Total Protein: 7.7 g/dL (ref 6.5–8.1)

## 2021-12-11 LAB — URINALYSIS, MICROSCOPIC (REFLEX): WBC, UA: NONE SEEN WBC/hpf (ref 0–5)

## 2021-12-11 LAB — CBC
HCT: 30.6 % — ABNORMAL LOW (ref 36.0–46.0)
Hemoglobin: 10.4 g/dL — ABNORMAL LOW (ref 12.0–15.0)
MCH: 28.3 pg (ref 26.0–34.0)
MCHC: 34 g/dL (ref 30.0–36.0)
MCV: 83.4 fL (ref 80.0–100.0)
Platelets: 305 10*3/uL (ref 150–400)
RBC: 3.67 MIL/uL — ABNORMAL LOW (ref 3.87–5.11)
RDW: 12.8 % (ref 11.5–15.5)
WBC: 9.4 10*3/uL (ref 4.0–10.5)
nRBC: 0 % (ref 0.0–0.2)

## 2021-12-11 LAB — URINALYSIS, ROUTINE W REFLEX MICROSCOPIC
Bilirubin Urine: NEGATIVE
Glucose, UA: NEGATIVE mg/dL
Ketones, ur: NEGATIVE mg/dL
Leukocytes,Ua: NEGATIVE
Nitrite: NEGATIVE
Protein, ur: NEGATIVE mg/dL
Specific Gravity, Urine: 1.02 (ref 1.005–1.030)
pH: 5 (ref 5.0–8.0)

## 2021-12-11 LAB — PREGNANCY, URINE: Preg Test, Ur: NEGATIVE

## 2021-12-11 LAB — LIPASE, BLOOD: Lipase: 42 U/L (ref 11–51)

## 2021-12-11 MED ORDER — LACTATED RINGERS IV BOLUS
1000.0000 mL | Freq: Once | INTRAVENOUS | Status: AC
Start: 2021-12-11 — End: 2021-12-11
  Administered 2021-12-11: 1000 mL via INTRAVENOUS

## 2021-12-11 NOTE — Discharge Instructions (Addendum)
You were seen here today for evaluation of your elevated BUN and creatinine.  Your labs have significantly improved from your initial lab at your PCP office to today's repeat.  You will need to continue drinking plenty of fluids, mainly water as discussed, especially over these next 2 days.  I would go to your PCP office on Monday to have them repeat your labs.  If you have any fever, darkened urine, blood in your urine, abdominal pain, nausea, vomiting, or back/flank pain, please return the nearest emergency department for evaluation.

## 2021-12-11 NOTE — ED Provider Notes (Signed)
[facility] EMERGENCY DEPARTMENT Provider Note   CSN: [id number] Arrival date & time: 12/11/21  1859     History Chief Complaint  Patient presents with   abnormal labs    Maria [name] is a 46 y.o. female presents to the emergency department for elevated creatinine levels from PCP office today.  The patient reports that 2 days ago, she was having food poisoning with diarrhea and vomiting that is since now stopped.  She had her blood drawn today that showed an elevated creatinine and was sent to the emergency department by her PCP.  She reports she feels little fatigued, otherwise normal and is not experiencing any abdominal pain, diarrhea, or vomiting any longer.  Denies any dysuria, hematuria, or dark urine.  Denies any flank pain. Her medical history includes iron-deficiency anemia, hypertension, insomnia, ADD, anxiety depression, migraines.  She is currently on lisinopril HCTZ for her blood pressure although she was advised by her PCP to cut that dose in half due to her recent 30 pound weight loss from increased exercise and her now lower blood pressures.  HPI     Home Medications Prior to Admission medications   Medication Sig Start Date End Date Taking? Authorizing Provider  amphetamine-dextroamphetamine (ADDERALL) 20 MG tablet Take 20 mg by mouth 3 (three) times daily.    [provider]  cetirizine (ZYRTEC) 10 MG tablet Take 10 mg by mouth daily.      [provider]  escitalopram (LEXAPRO) 20 MG tablet Take 20 mg by mouth daily.    [provider]  hydrochlorothiazide (MICROZIDE) 12.5 MG capsule Take 2 capsules (25 mg total) by mouth daily. 07/27/11 07/26/12  Artelia Laroche, CNM  labetalol (NORMODYNE) 200 MG tablet Take 2 tablets (400 mg total) by mouth 2 (two) times daily. Patient taking differently: Take 100 mg by mouth 2 (two) times daily.  07/27/11 07/26/12  Artelia Laroche, CNM  LORazepam (ATIVAN) 2 MG tablet Take 2 mg by mouth every 6 (six)  hours as needed for sleep (as needed).    [provider]  QUEtiapine (SEROQUEL) 200 MG tablet Take 200 mg by mouth at bedtime.    [provider]      Allergies    Other    Review of Systems   Review of Systems  Constitutional:  Positive for fatigue.   See HPI  Physical Exam Updated Vital Signs BP 102/65    Pulse 79    Temp 98.5 F (36.9 C) (Oral)    Resp 18    Ht 5\' 5"  (1.651 m)    Wt 86.6 kg    LMP 11/01/2021    SpO2 100%    BMI 31.78 kg/m  Physical Exam Vitals and nursing note reviewed.  Constitutional:      General: She is not in acute distress.    Appearance: Normal appearance. She is not ill-appearing or toxic-appearing.  HENT:     Head: Normocephalic and atraumatic.  Eyes:     General: No scleral icterus. Cardiovascular:     Rate and Rhythm: Normal rate and regular rhythm.  Pulmonary:     Effort: Pulmonary effort is normal. No respiratory distress.     Breath sounds: Normal breath sounds.  Abdominal:     General: Bowel sounds are normal.     Palpations: Abdomen is soft.     Tenderness: There is no abdominal tenderness. There is no guarding or rebound.  Musculoskeletal:        General: No swelling  or deformity.     Cervical back: Normal range of motion.  Skin:    General: Skin is warm and dry.  Neurological:     General: No focal deficit present.     Mental Status: She is alert. Mental status is at baseline.     Motor: No weakness.    ED Results / Procedures / Treatments   Labs (all labs ordered are listed, but only abnormal results are displayed) Labs Reviewed  COMPREHENSIVE METABOLIC PANEL - Abnormal; Notable for the following components:      Result Value   Sodium 132 (*)    Potassium 3.3 (*)    BUN 36 (*)    Creatinine, Ser 1.71 (*)    GFR, Estimated 37 (*)    All other components within normal limits  CBC - Abnormal; Notable for the following components:   RBC 3.67 (*)    Hemoglobin 10.4 (*)    HCT 30.6 (*)    All other  components within normal limits  URINALYSIS, ROUTINE W REFLEX MICROSCOPIC - Abnormal; Notable for the following components:   Hgb urine dipstick TRACE (*)    All other components within normal limits  URINALYSIS, MICROSCOPIC (REFLEX) - Abnormal; Notable for the following components:   Bacteria, UA RARE (*)    All other components within normal limits  LIPASE, BLOOD  PREGNANCY, URINE    EKG None  Radiology No results found.  Procedures Procedures    Medications Ordered in ED Medications - No data to display  ED Course/ Medical Decision Making/ A&P                           Medical Decision Making Amount and/or Complexity of Data Reviewed Labs: ordered.   46 year old female presents to the emergency department for elevated creatinine level.  Differential diagnosis includes was not limited to AKI, intrarenal, postrenal, prerenal, dehydration.  Vital signs show borderline hypotension, normal pulse rate, normal temperature, satting 100% on room air.  Physical exam shows a well-appearing no acute distress patient that appears stated age.  Unremarkable physical exam.  Will order basic labs and 1 L of LR.  On prior chart investigation through care everywhere, I was able to see her creatinine level from her PCP visit today was at 2.16  I independently reviewed and interpreted the patient's labs and imaging.  CBC shows mild anemia at 10.4 with last hemoglobin being 11.1 on 04-08-2021.  Patient reports she has iron deficiency anemia and is going to see a specialist due to it not improving with iron infusions.  No leukocytosis.  Urinalysis shows trace blood although no red blood cells seen on microscopy.  Rare bacteria seen.  Doubt UTI.  CMP from today shows mild hyponatremia 132.  Mild hypokalemia at 3.3.  Elevated BUN and creatinine at 36 and 1.71 respectively.  hCG negative.  Lipase 42.  At this time, the patient reports she had been drinking water from after her labs and effort to begin  rehydrated after her diarrhea vomiting episodes a few days ago.  Her creatinine has already improved from 2.16-1.71 in the span of just a few hours.  I likely think that this is due to dehydration.  Will order BMP after fluid bolus given.   Repeat BMP shows improving potassium and sodium.  Her creatinine and BUN have improved to 1.41 and 30 respectively.  Her GFR has also increased.  In a span of less than 12 hours  her creatinine has gone from 2.16-1.41.  Again, likely think this is due to dehydration.  I discussed with the patient admission versus oral rehydration and she is more amenable to going home for oral rehydration.  I think this is reasonable.  I advised her to stick with the half dose of her blood pressure medicine as advised by her PCP due to her recent weight loss and also dehydration.  I assured her that she was not hypotensive here, just mildly decreased blood pressure but still normal.  I stressed the importance of remaining well-hydrated over the next few days and to go to her PCP office on Monday for a redraw of her BMP to check her creatinine levels again.  I discussed with her strict return precautions.  The patient agrees to plan.  Patient is stable and being discharged home in good condition.  I discussed this case with my attending physician who cosigned this note including patient's presenting symptoms, physical exam, and planned diagnostics and interventions. Attending physician stated agreement with plan or made changes to plan which were implemented.   Final Clinical Impression(s) / ED Diagnoses Final diagnoses:  AKI (acute kidney injury) [facility])    Rx / [facility] Orders ED Discharge Orders     None         Sherrell Puller, PA-C 12/14/21 1544    Wyvonnia Dusky, MD 12/15/21 [phone]

## 2021-12-11 NOTE — ED Triage Notes (Signed)
First contact with patient. Patient arrived via triage from home stating her physician ran labs that were concerning - she was instructed that her "kidney labs" were off and needed to be evaluated. Patient has no complaints at this time. Pt is A&OX 4. Respirations even/unlabored.

## 2022-03-30 ENCOUNTER — Encounter: Payer: Self-pay | Admitting: Hematology and Oncology

## 2022-05-04 ENCOUNTER — Encounter: Payer: Self-pay | Admitting: Hematology and Oncology

## 2022-05-14 ENCOUNTER — Encounter: Payer: Self-pay | Admitting: Hematology and Oncology

## 2022-11-22 ENCOUNTER — Encounter: Payer: Self-pay | Admitting: Hematology and Oncology

## 2022-11-23 ENCOUNTER — Encounter: Payer: Self-pay | Admitting: Hematology and Oncology

## 2023-02-17 ENCOUNTER — Encounter: Payer: Self-pay | Admitting: Hematology and Oncology

## 2024-05-11 ENCOUNTER — Encounter: Payer: Self-pay | Admitting: Advanced Practice Midwife
